# Patient Record
Sex: Male | Born: 1975 | Smoking: Current every day smoker
Health system: Southern US, Community
[De-identification: ages and names within clinical notes are randomized; demographics above are authoritative.]

## PROBLEM LIST (undated history)

## (undated) DIAGNOSIS — Z972 Presence of dental prosthetic device (complete) (partial): Secondary | ICD-10-CM

## (undated) DIAGNOSIS — F319 Bipolar disorder, unspecified: Secondary | ICD-10-CM

## (undated) DIAGNOSIS — F209 Schizophrenia, unspecified: Secondary | ICD-10-CM

## (undated) HISTORY — PX: MULTIPLE TOOTH EXTRACTIONS: SHX2053

---

## 2019-04-11 ENCOUNTER — Other Ambulatory Visit (HOSPITAL_COMMUNITY): Payer: Self-pay | Admitting: Orthopedic Surgery

## 2019-04-11 DIAGNOSIS — M25511 Pain in right shoulder: Secondary | ICD-10-CM

## 2019-04-18 ENCOUNTER — Other Ambulatory Visit: Payer: Self-pay

## 2019-04-18 ENCOUNTER — Encounter (HOSPITAL_COMMUNITY): Payer: Self-pay | Admitting: *Deleted

## 2019-04-18 NOTE — Progress Notes (Signed)
Nurse made aware that SDW-Pre-op call was completed by pt Aunt " Luz Brazen, POA." at the end of assessment. Aunt reminded to bring POA documentation in on day of procedure. Aunt denies that pt C/O SOB, chest pain and being under the care of a cardiologist. Aunt denies that pt had a stress test, echo and cardiac cath. Aunt denies that pt had an EKG and chest x ray in the last year. Aunt denies recent labs. Aunt made aware to have pt stop taking vitamins and herbal medications. Aunt verbalized understanding of all pre-procedure instructions.

## 2019-04-20 ENCOUNTER — Ambulatory Visit (HOSPITAL_COMMUNITY): Admission: RE | Admit: 2019-04-20 | Payer: Medicaid Other | Source: Ambulatory Visit

## 2019-04-22 ENCOUNTER — Emergency Department (HOSPITAL_BASED_OUTPATIENT_CLINIC_OR_DEPARTMENT_OTHER): Payer: Medicaid Other

## 2019-04-22 ENCOUNTER — Encounter (HOSPITAL_BASED_OUTPATIENT_CLINIC_OR_DEPARTMENT_OTHER): Payer: Self-pay | Admitting: *Deleted

## 2019-04-22 ENCOUNTER — Emergency Department (HOSPITAL_COMMUNITY): Payer: Medicaid Other

## 2019-04-22 ENCOUNTER — Emergency Department (HOSPITAL_BASED_OUTPATIENT_CLINIC_OR_DEPARTMENT_OTHER)
Admission: EM | Admit: 2019-04-22 | Discharge: 2019-04-23 | Disposition: A | Discharge status: Home / Self Care | Payer: Medicaid Other | Attending: Emergency Medicine | Admitting: Emergency Medicine

## 2019-04-22 ENCOUNTER — Other Ambulatory Visit: Payer: Self-pay

## 2019-04-22 ENCOUNTER — Other Ambulatory Visit (HOSPITAL_COMMUNITY)
Admission: RE | Admit: 2019-04-22 | Discharge: 2019-04-22 | Disposition: A | Discharge status: Home / Self Care | Payer: Medicaid Other | Source: Ambulatory Visit | Attending: Orthopedic Surgery | Admitting: Orthopedic Surgery

## 2019-04-22 DIAGNOSIS — R270 Ataxia, unspecified: Secondary | ICD-10-CM

## 2019-04-22 DIAGNOSIS — R05 Cough: Secondary | ICD-10-CM | POA: Insufficient documentation

## 2019-04-22 DIAGNOSIS — R260 Ataxic gait: Secondary | ICD-10-CM | POA: Diagnosis present

## 2019-04-22 DIAGNOSIS — Z20828 Contact with and (suspected) exposure to other viral communicable diseases: Secondary | ICD-10-CM | POA: Insufficient documentation

## 2019-04-22 DIAGNOSIS — R531 Weakness: Secondary | ICD-10-CM | POA: Diagnosis not present

## 2019-04-22 DIAGNOSIS — Z79899 Other long term (current) drug therapy: Secondary | ICD-10-CM | POA: Diagnosis not present

## 2019-04-22 DIAGNOSIS — F17210 Nicotine dependence, cigarettes, uncomplicated: Secondary | ICD-10-CM | POA: Diagnosis not present

## 2019-04-22 DIAGNOSIS — R296 Repeated falls: Secondary | ICD-10-CM | POA: Insufficient documentation

## 2019-04-22 DIAGNOSIS — Z01812 Encounter for preprocedural laboratory examination: Secondary | ICD-10-CM | POA: Insufficient documentation

## 2019-04-22 LAB — URINALYSIS, ROUTINE W REFLEX MICROSCOPIC
Bilirubin Urine: NEGATIVE
Glucose, UA: NEGATIVE mg/dL
Hgb urine dipstick: NEGATIVE
Ketones, ur: NEGATIVE mg/dL
Leukocytes,Ua: NEGATIVE
Nitrite: NEGATIVE
Protein, ur: NEGATIVE mg/dL
Specific Gravity, Urine: 1.02 (ref 1.005–1.030)
pH: 7.5 (ref 5.0–8.0)

## 2019-04-22 LAB — RAPID URINE DRUG SCREEN, HOSP PERFORMED
Amphetamines: NOT DETECTED
Barbiturates: NOT DETECTED
Benzodiazepines: NOT DETECTED
Cocaine: NOT DETECTED
Opiates: NOT DETECTED
Tetrahydrocannabinol: NOT DETECTED

## 2019-04-22 LAB — CBC WITH DIFFERENTIAL/PLATELET
Abs Immature Granulocytes: 0.01 10*3/uL (ref 0.00–0.07)
Basophils Absolute: 0 10*3/uL (ref 0.0–0.1)
Basophils Relative: 1 %
Eosinophils Absolute: 0.1 10*3/uL (ref 0.0–0.5)
Eosinophils Relative: 1 %
HCT: 41.6 % (ref 39.0–52.0)
Hemoglobin: 13 g/dL (ref 13.0–17.0)
Immature Granulocytes: 0 %
Lymphocytes Relative: 49 %
Lymphs Abs: 2.1 10*3/uL (ref 0.7–4.0)
MCH: 29.3 pg (ref 26.0–34.0)
MCHC: 31.3 g/dL (ref 30.0–36.0)
MCV: 93.9 fL (ref 80.0–100.0)
Monocytes Absolute: 0.4 10*3/uL (ref 0.1–1.0)
Monocytes Relative: 9 %
Neutro Abs: 1.8 10*3/uL (ref 1.7–7.7)
Neutrophils Relative %: 40 %
Platelets: 211 10*3/uL (ref 150–400)
RBC: 4.43 MIL/uL (ref 4.22–5.81)
RDW: 12.4 % (ref 11.5–15.5)
WBC: 4.4 10*3/uL (ref 4.0–10.5)
nRBC: 0 % (ref 0.0–0.2)

## 2019-04-22 LAB — ETHANOL: Alcohol, Ethyl (B): <10 mg/dL (ref <10)

## 2019-04-22 LAB — COMPREHENSIVE METABOLIC PANEL
ALT: 11 U/L (ref 0–44)
AST: 17 U/L (ref 15–41)
Albumin: 3.8 g/dL (ref 3.5–5.0)
Alkaline Phosphatase: 66 U/L (ref 38–126)
Anion gap: 9 (ref 5–15)
BUN: 15 mg/dL (ref 6–20)
CO2: 27 mmol/L (ref 22–32)
Calcium: 8.9 mg/dL (ref 8.9–10.3)
Chloride: 104 mmol/L (ref 98–111)
Creatinine, Ser: 0.77 mg/dL (ref 0.61–1.24)
GFR calc Af Amer: >60 mL/min (ref >60)
GFR calc non Af Amer: >60 mL/min (ref >60)
Glucose, Bld: 93 mg/dL (ref 70–99)
Potassium: 4.3 mmol/L (ref 3.5–5.1)
Sodium: 140 mmol/L (ref 135–145)
Total Bilirubin: 1 mg/dL (ref 0.3–1.2)
Total Protein: 7.3 g/dL (ref 6.5–8.1)

## 2019-04-22 LAB — SARS CORONAVIRUS 2 AG (30 MIN TAT): SARS Coronavirus 2 Ag: NEGATIVE

## 2019-04-22 LAB — SARS CORONAVIRUS 2 (TAT 6-24 HRS): SARS Coronavirus 2: NEGATIVE

## 2019-04-22 LAB — ACETAMINOPHEN LEVEL: Acetaminophen (Tylenol), Serum: <10 ug/mL — ABNORMAL LOW (ref 10–30)

## 2019-04-22 LAB — SALICYLATE LEVEL: Salicylate Lvl: <7 mg/dL — ABNORMAL LOW (ref 7.0–30.0)

## 2019-04-22 MED ORDER — GADOBUTROL 1 MMOL/ML IV SOLN
5.0000 mL | Freq: Once | INTRAVENOUS | Status: AC | PRN
  Administered 2019-04-22: 5 mL via INTRAVENOUS

## 2019-04-22 NOTE — ED Provider Notes (Signed)
[facility] EMERGENCY DEPARTMENT Provider Note   CSN: [id number] Arrival date & time: 04/22/19  1354     History Chief Complaint  Patient presents with  . Covid Symptoms  . Gait Problem    Clayton Cohen is a 43 y.o. male.  HPI   43 year old male with a history of bipolar disorder, schizophrenia, hyperlipidemia, who presents emergency department today for evaluation of URI symptoms, generalized weakness and multiple falls.  History somewhat limited due to patient's difficulty with communication however family is at bedside and assist with the history.  Family states that for the last several days patient has been more off balance and has been falling frequently which is not normal for him.  She states that he was admitted to the hospital back in September for a possible overdose on Zoloft and she was told at that time that the patient had "swelling in his brain ".  She is not sure of what his official diagnosis was and does not have records with her at present.  She is concerned because of his multiple falls and she feels like his forehead is swollen.  He has not had any fevers.  He has been coughing for the last week.  Patient denies any shortness of breath.  He does have some right-sided chest pain only when he coughs.  He has had no vomiting or diarrhea.  4:40 PM Contacted pts PCP to obtain hx. They last saw the pt 12/2 for right shoulder pain. They are unaware of any prior neuro diagnosis.   Received records from his admission to [facility]. Pt was admitted for AMS and w/u revealed vasogenic brain edema, toxic metabolic encephalopathy, and hemorrhage to the globus pallidus secondary which was thought to be secondary to toxic exposure such as "methanol/carbon monoxide/cyanide, ethylene glycol, or organophosphates" (see MRI read below).  Past Medical History:  Diagnosis Date  . Bipolar disorder (HCC)   . Schizophrenia (HCC)   . Wears dentures     There are no problems to  display for this patient.   Past Surgical History:  Procedure Laterality Date  . MULTIPLE TOOTH EXTRACTIONS         No family history on file.  Social History   Tobacco Use  . Smoking status: Current Every Day Smoker    Packs/day: 0.50    Types: Cigarettes  . Smokeless tobacco: Never Used  Substance Use Topics  . Alcohol use: Not Currently  . Drug use: Not Currently    Home Medications Prior to Admission medications   Medication Sig Start Date End Date Taking? Authorizing Provider  cholecalciferol (VITAMIN D3) 25 MCG (1000 UT) tablet Take 1,000 Units by mouth daily.    [provider]  folic acid (FOLVITE) 1 MG tablet Take 1 mg by mouth daily.    [provider]  haloperidol (HALDOL) 5 MG tablet Take 5 mg by mouth daily.    [provider]  magnesium oxide (MAG-OX) 400 MG tablet Take 400 mg by mouth 2 (two) times daily.    [provider]  traZODone (DESYREL) 100 MG tablet Take 100 mg by mouth at bedtime as needed for sleep.    [provider]  vitamin B-12 (CYANOCOBALAMIN) 1000 MCG tablet Take 1,000 mcg by mouth daily.    [provider]    Allergies    Patient has no known allergies.  Review of Systems   Review of Systems  Constitutional: Negative for chills and fever.  HENT: Negative for ear  pain and sore throat.   Eyes: Negative for pain and visual disturbance.  Respiratory: Positive for cough. Negative for shortness of breath.   Cardiovascular: Positive for chest pain (with cough only).  Gastrointestinal: Negative for abdominal pain, constipation, diarrhea, nausea and vomiting.  Genitourinary: Negative for dysuria and hematuria.  Musculoskeletal: Negative for arthralgias and back pain.  Skin: Negative for color change and rash.  Neurological: Positive for weakness (generalized) and headaches.       Multiple falls, off balance  All other systems reviewed and are negative.   Physical Exam Updated Vital  Signs BP 120/81   Pulse 63   Temp 98.5 F (36.9 C) (Oral)   Resp (!) 24   SpO2 99%   Physical Exam Vitals and nursing note reviewed.  Constitutional:      Appearance: He is well-developed.  HENT:     Head: Normocephalic and atraumatic.  Eyes:     Conjunctiva/sclera: Conjunctivae normal.  Cardiovascular:     Rate and Rhythm: Normal rate and regular rhythm.     Heart sounds: Normal heart sounds. No murmur.  Pulmonary:     Effort: Pulmonary effort is normal. No respiratory distress.     Breath sounds: Normal breath sounds. No wheezing, rhonchi or rales.  Abdominal:     General: Bowel sounds are normal.     Palpations: Abdomen is soft.     Tenderness: There is no abdominal tenderness. There is no right CVA tenderness, left CVA tenderness, guarding or rebound.  Musculoskeletal:     Cervical back: Neck supple.  Skin:    General: Skin is warm and dry.  Neurological:     Mental Status: He is alert.     Comments: Mental Status:  Alert, thought content appropriate, able to give a coherent history. Speech fluent without evidence of aphasia. Able to follow 2 step commands without difficulty.  Cranial Nerves:  II:   pupils equal, round, reactive to light III,IV, VI: ptosis not present, extra-ocular motions intact bilaterally  V,VII: smile symmetric, facial light touch sensation equal VIII: hearing grossly normal to voice  X: uvula elevates symmetrically  XI: bilateral shoulder shrug symmetric and strong XII: midline tongue extension without fassiculations Motor:  Normal tone. 5/5 strength of BUE and BLE major muscle groups including strong and equal grip strength and dorsiflexion/plantar flexion Sensory: light touch normal in all extremities. DTRs: biceps and achilles 2+ symmetric b/l Cerebellar: mild dysmetria with finger to nose on the right Gait: ataxia gait, unsteady with heel to shin      ED Results / Procedures / Treatments   Labs (all labs ordered are listed, but only  abnormal results are displayed) Labs Reviewed  ACETAMINOPHEN LEVEL - Abnormal; Notable for the following components:      Result Value   Acetaminophen (Tylenol), Serum <10 (*)    All other components within normal limits  SALICYLATE LEVEL - Abnormal; Notable for the following components:   Salicylate Lvl <4.0 (*)    All other components within normal limits  SARS CORONAVIRUS 2 AG (30 MIN TAT)  SARS CORONAVIRUS 2 (TAT 6-24 HRS)  CBC WITH DIFFERENTIAL/PLATELET  COMPREHENSIVE METABOLIC PANEL  URINALYSIS, ROUTINE W REFLEX MICROSCOPIC  RAPID URINE DRUG SCREEN, HOSP PERFORMED  ETHANOL  VOLATILES,BLD-ACETONE,ETHANOL,ISOPROP,METHANOL    EKG None  Radiology CT Head Wo Contrast  Result Date: 04/22/2019 CLINICAL DATA:  Headache and fatigue EXAM: CT HEAD WITHOUT CONTRAST TECHNIQUE: Contiguous axial images were obtained from the base of the skull through the vertex without intravenous  contrast. COMPARISON:  None. FINDINGS: Brain: Ventricles and sulci are within normal limits. There is no intracranial mass, hemorrhage, extra-axial fluid collection, or midline shift. The brain parenchyma appears unremarkable without evidence of acute infarct. There is basal ganglia calcification bilaterally, likely physiologic. Vascular: There is no hyperdense vessel. There is no appreciable vascular calcification. Skull: The bony calvarium appears intact. Sinuses/Orbits: There is mucosal thickening with opacification in multiple ethmoid air cells. There is mucosal thickening in each anterior sphenoid sinus. Orbits appear symmetric bilaterally. Other: Mastoid air cells are clear. There is debris in each external auditory canal. IMPRESSION: Brain parenchyma appears unremarkable.  No mass or hemorrhage. Areas of paranasal sinus disease. Probable cerumen in each external auditory canal. Electronically Signed   By: Bretta Bang III M.D.   On: 04/22/2019 17:07   DG Chest Portable 1 View  Result Date: 04/22/2019  CLINICAL DATA:  Cough. Additional history provided: Fatigue, cough and headache for 3 days. EXAM: PORTABLE CHEST 1 VIEW COMPARISON:  No pertinent prior studies available for comparison. FINDINGS: Heart size within normal limits. There is no airspace consolidation within the lungs. No evidence of pleural effusion or pneumothorax. No acute bony abnormality. IMPRESSION: No evidence of acute cardiopulmonary abnormality. Electronically Signed   By: Jackey Loge DO   On: 04/22/2019 17:10         Procedures Procedures (including critical care time)  Medications Ordered in ED Medications - No data to display  ED Course  I have reviewed the triage vital signs and the nursing notes.  Pertinent labs & imaging results that were available during my care of the patient were reviewed by me and considered in my medical decision making (see chart for details).    MDM Rules/Calculators/A&P                     43 year old male with history of schizophrenia, bipolar, presenting with URI symptoms and ataxia.  Recent history of vasogenic brain edema, toxic metabolic encephalopathy, and hemorrhage to the globus pallidus secondary which was thought to be secondary to toxic exposure  On exam patient noted to be ataxic. Labs reassuring. UA neg for UTI. CT head neg. POC COVID neg. Send out COVID added. CXR neg. CT head neg.   Received records from [facility]. Added tox screen w/u. Aunt states pt lives with her and he does not have access to his meds. She denies knowledge that he has ingested any toxic substances and states he does not have access to any. She manages all of his medications at home.  7:23 PM CONSULT with Dr. Otelia Limes with neurology who recommends MRI w and w/o contrast and to reconsult neuro following results.   7:30 PM CONSULT With Dr. Anitra Lauth who accepts patient for transfer to [facility] ED. He will be sent with paperwork obtained from [facility].   8:18 PM Carelink at [facility] to transfer pt  to [facility] for MRI.   Pt seen in conjunction with Dr. Pilar Plate who personally evaluated the patient and is in agreement with plan.  Final Clinical Impression(s) / ED Diagnoses Final diagnoses:  Ataxia    Rx / DC Orders ED Discharge Orders    None       Rayne Du 04/22/19 2027    Sabas Sous, MD 04/23/19 [phone]

## 2019-04-22 NOTE — ED Notes (Signed)
Report given to charge @ [facility] ED

## 2019-04-22 NOTE — ED Notes (Signed)
[address] [place], [place]) [phone]

## 2019-04-22 NOTE — ED Notes (Signed)
Pt transported to MRI 

## 2019-04-22 NOTE — ED Triage Notes (Signed)
Fatigue, cough and headache x 3 days. He had a Covid test this am. Results are pending. Hx of head injury in September.

## 2019-04-22 NOTE — ED Notes (Signed)
Attempted to call report to Charge @ [facility], active CPR in progress, will attempt to call back later. Will send transport paperwork as well as results notes from [facility] will Carelink.

## 2019-04-22 NOTE — ED Provider Notes (Signed)
9:26 PM patient arrives from [place] for continuation of evaluation of increasing falls and ataxia.  Patient was admitted to [facility] September-October 2020.  Records obtained prior reviewed by myself.  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  He has a history of excessive alcohol and drug use as well.  Neurology was contacted and recommended MRI.  Patient is unable to give much history.  He is awake and alert and answers questions with mumbling incomprehensible speech.  He does not appear to be in any distress.  I reviewed records from [place] as well as [facility] records.  Will ensure that blood volatiles are drawn.  No strong suspicion of illicit substances given that he is currently living with his aunt.  Will obtain MRI and reconsult neuro.  BP 130/71 (BP Location: Left Arm)   Pulse 68   Temp 98.2 F (36.8 C) (Oral)   Resp 14   SpO2 100%    11:39 PM PT in MRI. Sign out to [employer].     Carlisle Cater, PA-C 04/22/19 2339    Margette Fast, MD 04/23/19 2033

## 2019-04-23 ENCOUNTER — Encounter (HOSPITAL_COMMUNITY): Payer: Self-pay | Admitting: Certified Registered Nurse Anesthetist

## 2019-04-23 ENCOUNTER — Ambulatory Visit (HOSPITAL_COMMUNITY): Admission: RE | Admit: 2019-04-23 | Payer: Medicaid Other | Source: Ambulatory Visit

## 2019-04-23 ENCOUNTER — Encounter (HOSPITAL_COMMUNITY): Payer: Self-pay | Admitting: Anesthesiology

## 2019-04-23 ENCOUNTER — Ambulatory Visit (HOSPITAL_COMMUNITY): Admission: RE | Admit: 2019-04-23 | Payer: Medicaid Other | Source: Home / Self Care

## 2019-04-23 ENCOUNTER — Encounter (HOSPITAL_COMMUNITY): Payer: Self-pay

## 2019-04-23 HISTORY — DX: Bipolar disorder, unspecified: F31.9

## 2019-04-23 HISTORY — DX: Schizophrenia, unspecified: F20.9

## 2019-04-23 HISTORY — DX: Presence of dental prosthetic device (complete) (partial): Z97.2

## 2019-04-23 LAB — BASIC METABOLIC PANEL
Anion gap: 8 (ref 5–15)
BUN: 13 mg/dL (ref 6–20)
CO2: 27 mmol/L (ref 22–32)
Calcium: 9.3 mg/dL (ref 8.9–10.3)
Chloride: 104 mmol/L (ref 98–111)
Creatinine, Ser: 0.83 mg/dL (ref 0.61–1.24)
GFR calc Af Amer: >60 mL/min (ref >60)
GFR calc non Af Amer: >60 mL/min (ref >60)
Glucose, Bld: 87 mg/dL (ref 70–99)
Potassium: 4.2 mmol/L (ref 3.5–5.1)
Sodium: 139 mmol/L (ref 135–145)

## 2019-04-23 LAB — SARS CORONAVIRUS 2 (TAT 6-24 HRS): SARS Coronavirus 2: NEGATIVE

## 2019-04-23 SURGERY — MRI WITH ANESTHESIA
Anesthesia: General | Laterality: Right

## 2019-04-23 MED ORDER — ACETAMINOPHEN 325 MG PO TABS
650.0000 mg | ORAL_TABLET | Freq: Once | ORAL | Status: AC
  Administered 2019-04-23: 650 mg via ORAL
  Filled 2019-04-23: qty 2

## 2019-04-23 NOTE — ED Provider Notes (Signed)
Patient signed out to me at shift change.  I had a long conversation with the patient's aunt, who states that the patient had an injury to his brain from a toxic metabolic process, and was admitted to [facility] back in the fall 2020.  He subsequently stated a rehab center, no lives with his aunt.  His aunt reports that over the past week he has had some increased difficulty with walking and generalized weakness.  She states that he has fallen in the past.  She states that today she noticed some swelling on his forehead, and thought that his brain might have been swelling, and so she brought him to the emergency department for evaluation.  Patient was originally seen at [facility], but was transferred to [facility] ED for MRI given past medical history.  MRI shows no new acute process.  I reviewed the MRI with Dr. Cheral Marker, from neurology, who states that all of the MRI findings are old, and that they do not explain the patient's current symptoms.  Patient ambulates to the bathroom without any difficulty.  I ambulated the patient myself, and he was walking with a steady gait.  He has been here approximately 10 hours.  I am concerned that the patient's symptoms might be caused by his Haldol, which he has been taking for schizophrenia.  Patient is requesting to be discharged.  After long conversation with his aunt, all parties are in agreement that the patient can be safely discharged from the emergency department tonight, but that he will require further follow-up with neurology and with psychiatry.  Regarding the primary reason for the ED visit tonight, the head swelling, there is no traumatic injury seen on MRI.  Patient has no focal neuro deficits.  I find him safe and stable for discharge.   Montine Circle, PA-C 04/23/19 3578    Merrily Pew, MD 04/23/19 [phone]

## 2019-04-23 NOTE — Discharge Instructions (Addendum)
Today your MRI shows findings that are similar to what were seen during your admission at [facility] neurologist has reviewed the MRI, and there are no acute/new changes that need to be addressed tonight.  Regarding the MRI, you can follow-up with your regular outpatient neurologist.  With regard to the fall, swelling on the forehead, there is no skull fracture or bleeding.  You are cleared to be released from the emergency department.  It is possible that the Haldol that you take is contributing to your decreased coordination.  Please discuss this with your psychiatrist.  Based on your laboratory results tonight, and discussion with our specialist, no further work-up is needed in the emergency department, and you can go home and be seen by your regular doctors.

## 2019-04-23 NOTE — ED Notes (Signed)
Patient verbalizes understanding of discharge instructions. Opportunity for questioning and answers were provided. Armband removed by staff, pt discharged from ED to home in wheelchair.

## 2019-04-23 NOTE — ED Notes (Signed)
Pt ambulated well to BR with no assistance.

## 2019-05-06 ENCOUNTER — Emergency Department (HOSPITAL_BASED_OUTPATIENT_CLINIC_OR_DEPARTMENT_OTHER)
Admission: EM | Admit: 2019-05-06 | Discharge: 2019-05-06 | Disposition: A | Discharge status: Home / Self Care | Payer: Medicaid Other | Attending: Emergency Medicine | Admitting: Emergency Medicine

## 2019-05-06 ENCOUNTER — Encounter (HOSPITAL_BASED_OUTPATIENT_CLINIC_OR_DEPARTMENT_OTHER): Payer: Self-pay | Admitting: *Deleted

## 2019-05-06 ENCOUNTER — Other Ambulatory Visit: Payer: Self-pay

## 2019-05-06 ENCOUNTER — Emergency Department (HOSPITAL_BASED_OUTPATIENT_CLINIC_OR_DEPARTMENT_OTHER): Payer: Medicaid Other

## 2019-05-06 DIAGNOSIS — Y9389 Activity, other specified: Secondary | ICD-10-CM | POA: Insufficient documentation

## 2019-05-06 DIAGNOSIS — F17210 Nicotine dependence, cigarettes, uncomplicated: Secondary | ICD-10-CM | POA: Insufficient documentation

## 2019-05-06 DIAGNOSIS — F319 Bipolar disorder, unspecified: Secondary | ICD-10-CM | POA: Insufficient documentation

## 2019-05-06 DIAGNOSIS — W0110XA Fall on same level from slipping, tripping and stumbling with subsequent striking against unspecified object, initial encounter: Secondary | ICD-10-CM | POA: Insufficient documentation

## 2019-05-06 DIAGNOSIS — Z79899 Other long term (current) drug therapy: Secondary | ICD-10-CM | POA: Insufficient documentation

## 2019-05-06 DIAGNOSIS — Y92002 Bathroom of unspecified non-institutional (private) residence single-family (private) house as the place of occurrence of the external cause: Secondary | ICD-10-CM | POA: Diagnosis not present

## 2019-05-06 DIAGNOSIS — S2242XA Multiple fractures of ribs, left side, initial encounter for closed fracture: Secondary | ICD-10-CM | POA: Insufficient documentation

## 2019-05-06 DIAGNOSIS — F209 Schizophrenia, unspecified: Secondary | ICD-10-CM | POA: Insufficient documentation

## 2019-05-06 DIAGNOSIS — Y999 Unspecified external cause status: Secondary | ICD-10-CM | POA: Diagnosis not present

## 2019-05-06 DIAGNOSIS — S299XXA Unspecified injury of thorax, initial encounter: Secondary | ICD-10-CM | POA: Diagnosis present

## 2019-05-06 DIAGNOSIS — R2681 Unsteadiness on feet: Secondary | ICD-10-CM

## 2019-05-06 DIAGNOSIS — W19XXXA Unspecified fall, initial encounter: Secondary | ICD-10-CM

## 2019-05-06 LAB — CBC WITH DIFFERENTIAL/PLATELET
Abs Immature Granulocytes: 0.02 10*3/uL (ref 0.00–0.07)
Basophils Absolute: 0 10*3/uL (ref 0.0–0.1)
Basophils Relative: 0 %
Eosinophils Absolute: 0.1 10*3/uL (ref 0.0–0.5)
Eosinophils Relative: 1 %
HCT: 41.2 % (ref 39.0–52.0)
Hemoglobin: 13 g/dL (ref 13.0–17.0)
Immature Granulocytes: 0 %
Lymphocytes Relative: 30 %
Lymphs Abs: 2 10*3/uL (ref 0.7–4.0)
MCH: 29.9 pg (ref 26.0–34.0)
MCHC: 31.6 g/dL (ref 30.0–36.0)
MCV: 94.7 fL (ref 80.0–100.0)
Monocytes Absolute: 0.6 10*3/uL (ref 0.1–1.0)
Monocytes Relative: 9 %
Neutro Abs: 4 10*3/uL (ref 1.7–7.7)
Neutrophils Relative %: 60 %
Platelets: 210 10*3/uL (ref 150–400)
RBC: 4.35 MIL/uL (ref 4.22–5.81)
RDW: 12 % (ref 11.5–15.5)
WBC: 6.7 10*3/uL (ref 4.0–10.5)
nRBC: 0 % (ref 0.0–0.2)

## 2019-05-06 LAB — BASIC METABOLIC PANEL
Anion gap: 9 (ref 5–15)
BUN: 12 mg/dL (ref 6–20)
CO2: 28 mmol/L (ref 22–32)
Calcium: 9.2 mg/dL (ref 8.9–10.3)
Chloride: 100 mmol/L (ref 98–111)
Creatinine, Ser: 0.82 mg/dL (ref 0.61–1.24)
GFR calc Af Amer: >60 mL/min (ref >60)
GFR calc non Af Amer: >60 mL/min (ref >60)
Glucose, Bld: 89 mg/dL (ref 70–99)
Potassium: 4.1 mmol/L (ref 3.5–5.1)
Sodium: 137 mmol/L (ref 135–145)

## 2019-05-06 MED ORDER — FENTANYL CITRATE (PF) 100 MCG/2ML IJ SOLN
100.0000 ug | Freq: Once | INTRAMUSCULAR | Status: AC
  Administered 2019-05-06: 100 ug via INTRAVENOUS
  Filled 2019-05-06: qty 2

## 2019-05-06 MED ORDER — IOHEXOL 300 MG/ML  SOLN
80.0000 mL | Freq: Once | INTRAMUSCULAR | Status: AC | PRN
  Administered 2019-05-06: 80 mL via INTRAVENOUS

## 2019-05-06 MED ORDER — OXYCODONE HCL 5 MG PO TABS: 5.0000 mg | ORAL_TABLET | Freq: Four times a day (QID) | ORAL | 0 refills | Status: AC | PRN

## 2019-05-06 NOTE — ED Notes (Signed)
Family at bedside. 

## 2019-05-06 NOTE — ED Triage Notes (Signed)
He fell this am. C.o pain in his left ribs. He is ambulatory with a walker. He has limited verbal communication.

## 2019-05-06 NOTE — Discharge Instructions (Addendum)
You were seen in the ER for fall and back and rib pain  Imaging today showed 3 fractures of your left ninth, 10th and 11th ribs.  The lungs looked normal no injury or bleeding around this.  Rib fractures can be quite painful and pain can last for several weeks  Treatment includes pain control, deep breathing and monitoring for any signs of pneumonia  For pain and inflammation you can use a combination of ibuprofen and acetaminophen.  Take [phone] mg acetaminophen (tylenol) every 6 hours or 600 mg ibuprofen (advil, motrin) every 6 hours.  You can take these separately or combine them every 6 hours for maximum pain control. Do not exceed 4,000 mg acetaminophen or 2,400 mg ibuprofen in a 24 hour period.  Do not take ibuprofen containing products if you have history of kidney disease, ulcers, GI bleeding, severe acid reflux, or take a blood thinner.  Do not take acetaminophen if you have liver disease.   For break through and/or severe pain despite ibuprofen and acetaminophen regimen, take 5 mg oxycodone every 6 hours.  Oxycodone is a narcotic pain medication that has risk of overdose, death, dependence and abuse. Mild and expected side effects include nausea, stomach upset, drowsiness, constipation. Do not consume alcohol, drive or use heavy machinery while taking this medication. Do not leave unattended around children. Flush any remaining pills that you do not use and do not share.  The emergency department has a strict policy regarding prescription of narcotic medications. We prescribe a short course for acute, new pain or injuries. We are unable to refill this medication in the emergency department for chronic pain or repeatedly.  Refill need to be done by specialist or primary care provider or pain clinic.  Contact your primary care provider or specialist for chronic pain management and refill on narcotic medications.   Use incentive spirometry throughout the day as much as possible to help inflate  your lungs and take deep breaths  Return to the ER for worsening pain, fever greater than 100.4, cough, shortness of breath or any other signs of infection

## 2019-05-06 NOTE — ED Notes (Signed)
Pt. Aunt Clayton Cohen said the Pt. Fell against the toilet this morning hitting the L side of his rib and flank area.  Pt. C/o pain when he coughs  Sneezes or moves in certain positions.   Pt. In on the stretcher in no distress at this time.  Pt. In no distress with breathing and able to speak but does not due to history with psych.
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
## 2021-02-09 IMAGING — CT CT HEAD W/O CM
3 series · 16 of 47 positions shown, 19 images · non-contrast
Comparison: None.

CLINICAL DATA: Headache and fatigue

EXAM:
CT HEAD WITHOUT CONTRAST
TECHNIQUE: Contiguous axial images were obtained from the base of the skull
through the vertex without intravenous contrast.

[Series 2: head wo · axial · 0.43mm/px · z∈[+1266,+1406]mm · 10 of 34 slices shown, 13 images]
[im 3/34  brain]
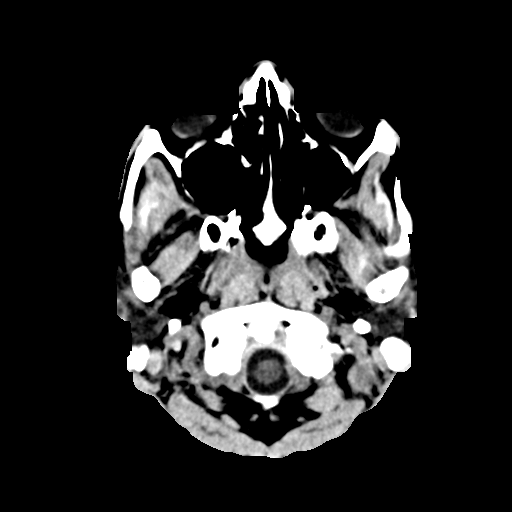
[im 3/34  bone]
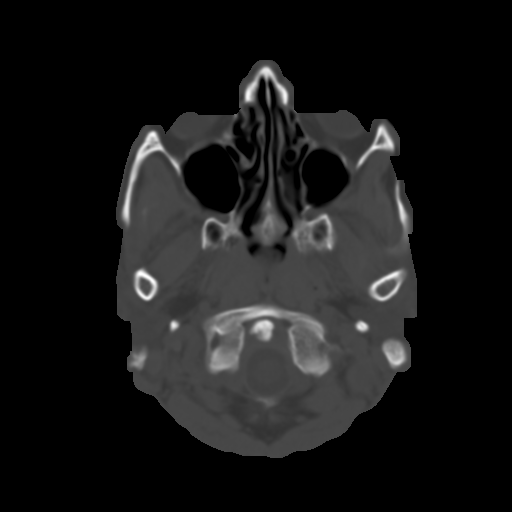
[im 6/34  brain]
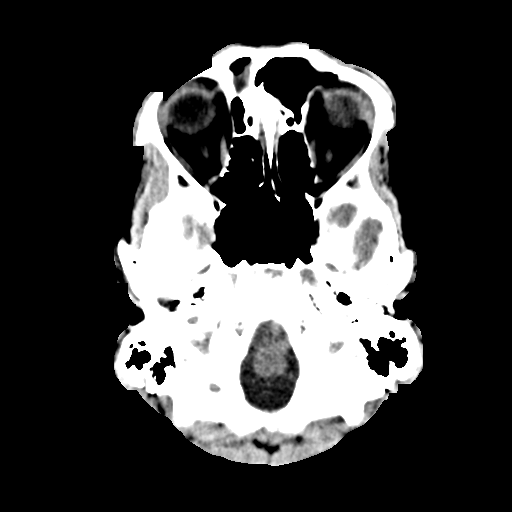
[im 10/34  brain]
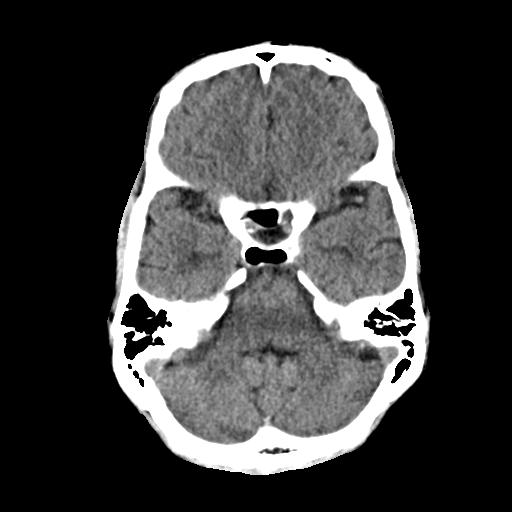
[im 12/34  brain]
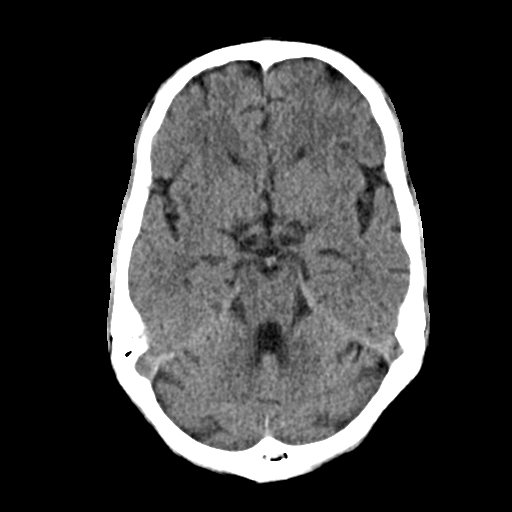
[im 15/34  brain]
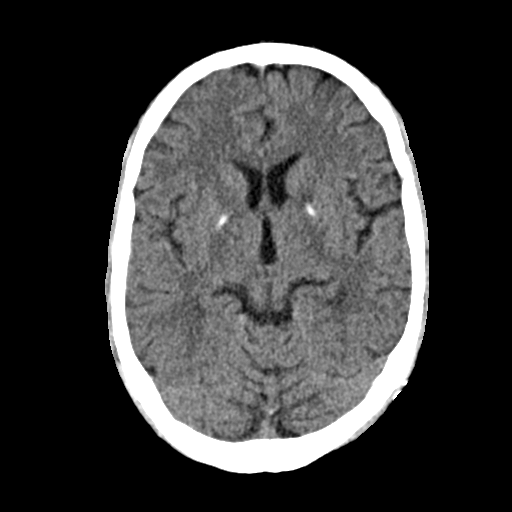
[im 15/34  bone]
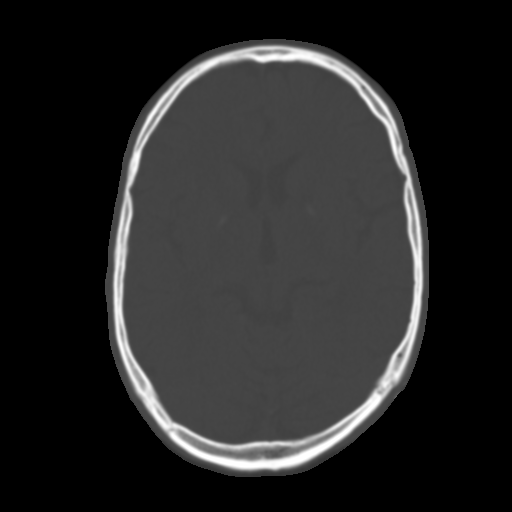
[im 19/34  brain]
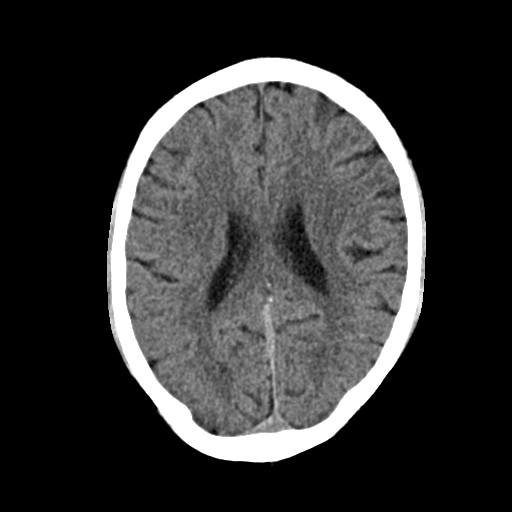
[im 22/34  brain]
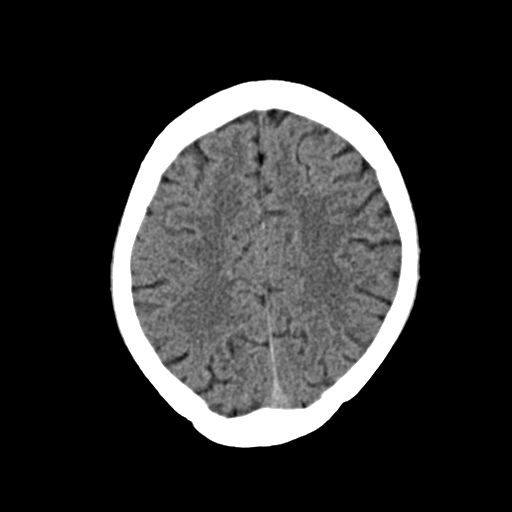
[im 26/34  brain]
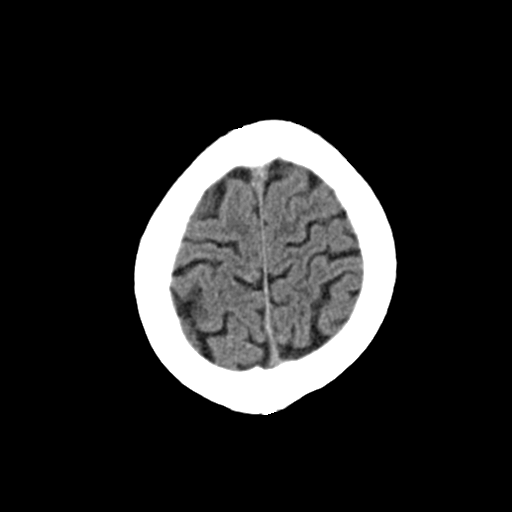
[im 28/34  brain]
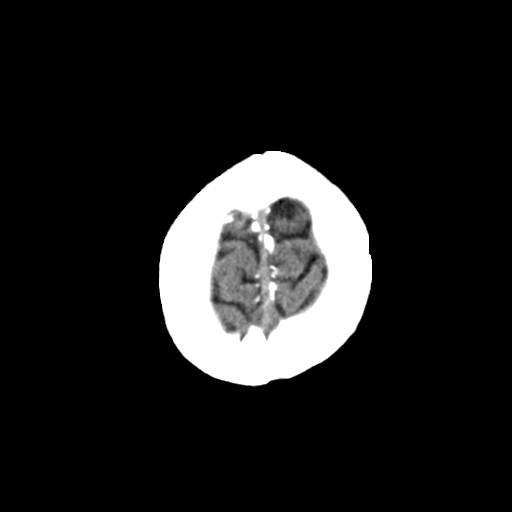
[im 28/34  bone]
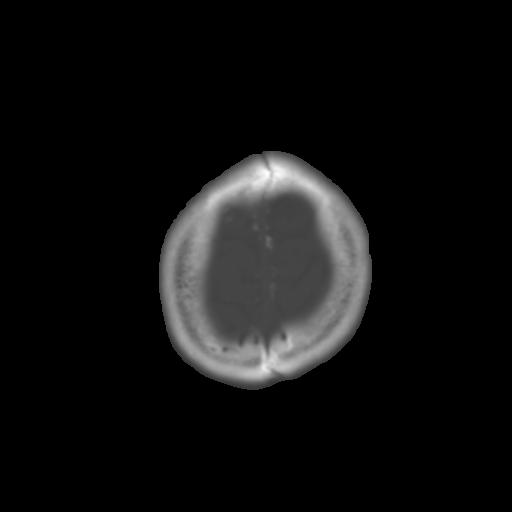
[im 31/34  brain]
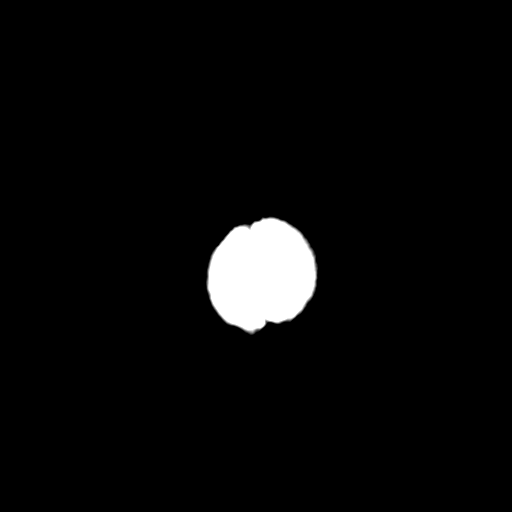

[Series 4: coronal soft · coronal · 0.33mm/px · 3 of 67 slices shown]
[im 23/67  brain]
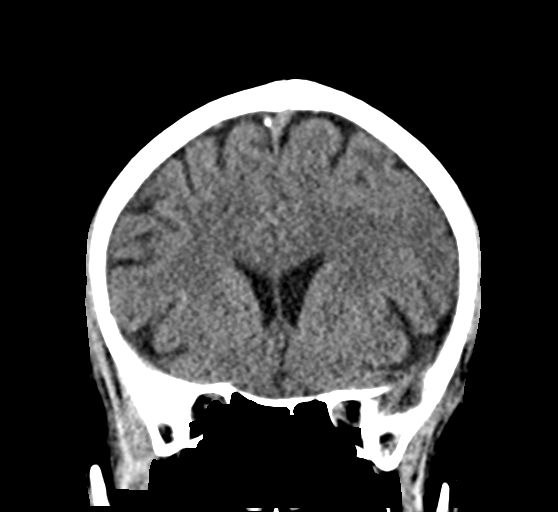
[im 30/67  brain]
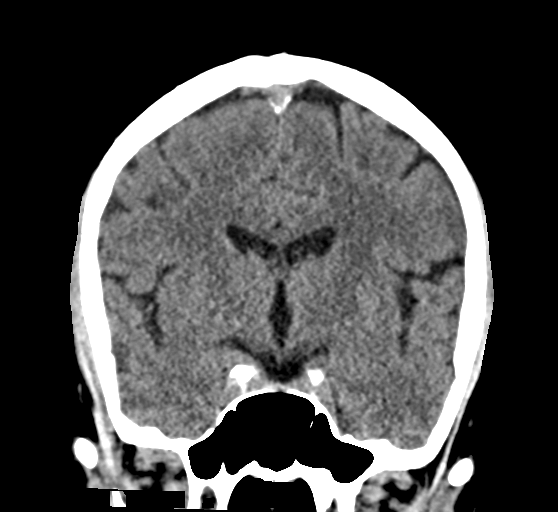
[im 37/67  brain]
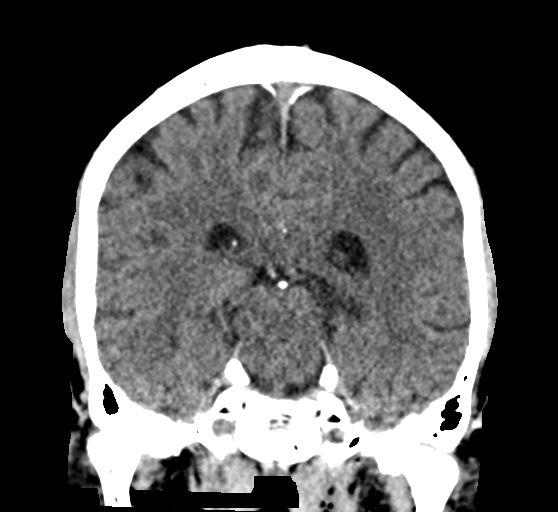

[Series 5: sag soft · sagittal · 0.33mm/px · 3 of 62 slices shown]
[im 21/62  brain]
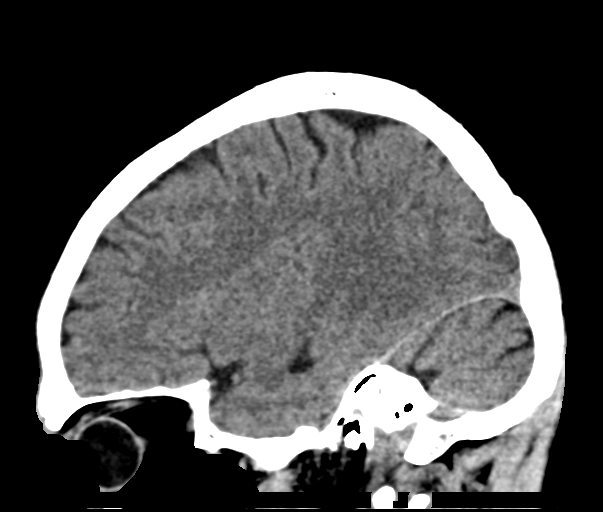
[im 31/62  brain]
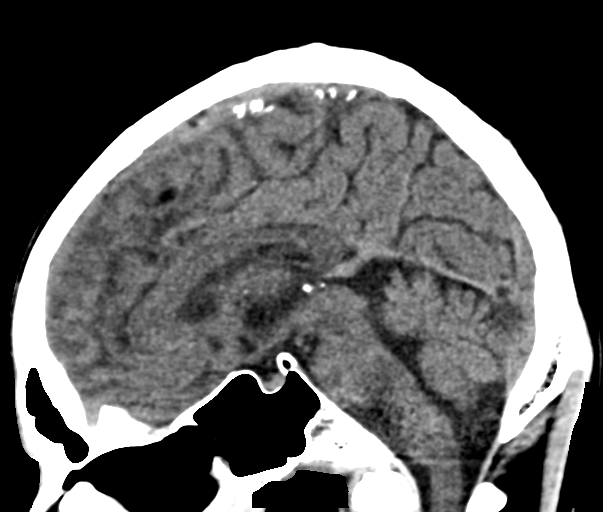
[im 41/62  brain]
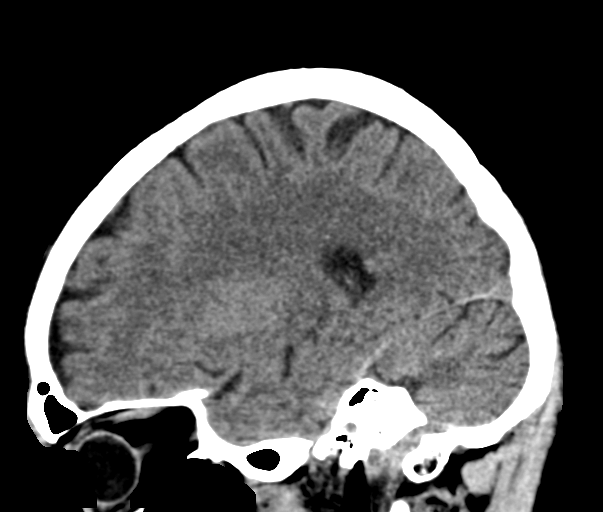

[16 of 47 positions shown; findings below may reference images not displayed]

FINDINGS: Brain: Ventricles and sulci are within normal limits. There is no
intracranial mass, hemorrhage, extra-axial fluid collection, or
midline shift. The brain parenchyma appears unremarkable without
evidence of acute infarct. There is basal ganglia calcification
bilaterally, likely physiologic.

Vascular: There is no hyperdense vessel. There is no appreciable
vascular calcification.

Skull: The bony calvarium appears intact.

Sinuses/Orbits: There is mucosal thickening with opacification in
multiple ethmoid air cells. There is mucosal thickening in each
anterior sphenoid sinus. Orbits appear symmetric bilaterally.

Other: Mastoid air cells are clear. There is debris in each external
auditory canal.
IMPRESSION: Brain parenchyma appears unremarkable.  No mass or hemorrhage.

Areas of paranasal sinus disease. Probable cerumen in each external
auditory canal.

## 2022-11-01 LAB — AMB RESULTS CONSOLE CBG: Glucose: 113
